# Patient Record
Sex: Male | Born: 1945 | Race: White | Hispanic: No | Marital: Married
Health system: Southern US, Community
[De-identification: ages and names within clinical notes are randomized; demographics above are authoritative.]

## PROBLEM LIST (undated history)

## (undated) DIAGNOSIS — K5090 Crohn's disease, unspecified, without complications: Secondary | ICD-10-CM

## (undated) DIAGNOSIS — K219 Gastro-esophageal reflux disease without esophagitis: Secondary | ICD-10-CM

## (undated) DIAGNOSIS — J45909 Unspecified asthma, uncomplicated: Secondary | ICD-10-CM

## (undated) HISTORY — DX: Gastro-esophageal reflux disease without esophagitis: K21.9

## (undated) HISTORY — DX: Crohn's disease, unspecified, without complications: K50.90

## (undated) HISTORY — DX: Unspecified asthma, uncomplicated: J45.909

---

## 2004-03-09 ENCOUNTER — Encounter: Admission: RE | Admit: 2004-03-09 | Discharge: 2004-03-09 | Payer: Self-pay | Admitting: Unknown Physician Specialty

## 2004-03-24 ENCOUNTER — Encounter: Admission: RE | Admit: 2004-03-24 | Discharge: 2004-03-24 | Payer: Self-pay | Admitting: Unknown Physician Specialty

## 2004-04-14 ENCOUNTER — Encounter: Admission: RE | Admit: 2004-04-14 | Discharge: 2004-04-14 | Payer: Self-pay | Admitting: Unknown Physician Specialty

## 2017-01-30 DIAGNOSIS — R079 Chest pain, unspecified: Secondary | ICD-10-CM | POA: Diagnosis not present

## 2017-01-30 DIAGNOSIS — K219 Gastro-esophageal reflux disease without esophagitis: Secondary | ICD-10-CM

## 2017-01-30 DIAGNOSIS — I2510 Atherosclerotic heart disease of native coronary artery without angina pectoris: Secondary | ICD-10-CM

## 2017-01-31 DIAGNOSIS — I2510 Atherosclerotic heart disease of native coronary artery without angina pectoris: Secondary | ICD-10-CM | POA: Diagnosis not present

## 2017-01-31 DIAGNOSIS — R079 Chest pain, unspecified: Secondary | ICD-10-CM | POA: Diagnosis not present

## 2017-01-31 DIAGNOSIS — K219 Gastro-esophageal reflux disease without esophagitis: Secondary | ICD-10-CM | POA: Diagnosis not present

## 2021-05-02 DIAGNOSIS — R6884 Jaw pain: Secondary | ICD-10-CM | POA: Diagnosis not present

## 2021-05-02 DIAGNOSIS — M542 Cervicalgia: Secondary | ICD-10-CM | POA: Diagnosis not present

## 2022-01-29 ENCOUNTER — Emergency Department (HOSPITAL_COMMUNITY): Payer: No Typology Code available for payment source

## 2022-01-29 ENCOUNTER — Other Ambulatory Visit: Payer: Self-pay

## 2022-01-29 ENCOUNTER — Emergency Department (HOSPITAL_COMMUNITY)
Admission: EM | Admit: 2022-01-29 | Discharge: 2022-01-29 | Disposition: A | Discharge status: Home / Self Care | Payer: No Typology Code available for payment source | Attending: Emergency Medicine | Admitting: Emergency Medicine

## 2022-01-29 ENCOUNTER — Encounter (HOSPITAL_COMMUNITY): Payer: Self-pay | Admitting: Emergency Medicine

## 2022-01-29 DIAGNOSIS — R079 Chest pain, unspecified: Secondary | ICD-10-CM

## 2022-01-29 DIAGNOSIS — I2510 Atherosclerotic heart disease of native coronary artery without angina pectoris: Secondary | ICD-10-CM | POA: Insufficient documentation

## 2022-01-29 DIAGNOSIS — Z7982 Long term (current) use of aspirin: Secondary | ICD-10-CM | POA: Insufficient documentation

## 2022-01-29 DIAGNOSIS — R61 Generalized hyperhidrosis: Secondary | ICD-10-CM | POA: Insufficient documentation

## 2022-01-29 LAB — CBC
HCT: 40.8 % (ref 39.0–52.0)
Hemoglobin: 13.6 g/dL (ref 13.0–17.0)
MCH: 32.6 pg (ref 26.0–34.0)
MCHC: 33.3 g/dL (ref 30.0–36.0)
MCV: 97.8 fL (ref 80.0–100.0)
Platelets: 249 10*3/uL (ref 150–400)
RBC: 4.17 MIL/uL — ABNORMAL LOW (ref 4.22–5.81)
RDW: 13 % (ref 11.5–15.5)
WBC: 5.1 10*3/uL (ref 4.0–10.5)
nRBC: 0 % (ref 0.0–0.2)

## 2022-01-29 LAB — BASIC METABOLIC PANEL
Anion gap: 7 (ref 5–15)
BUN: 17 mg/dL (ref 8–23)
CO2: 26 mmol/L (ref 22–32)
Calcium: 8.9 mg/dL (ref 8.9–10.3)
Chloride: 105 mmol/L (ref 98–111)
Creatinine, Ser: 0.91 mg/dL (ref 0.61–1.24)
GFR, Estimated: >60 mL/min (ref >60)
Glucose, Bld: 110 mg/dL — ABNORMAL HIGH (ref 70–99)
Potassium: 3.6 mmol/L (ref 3.5–5.1)
Sodium: 138 mmol/L (ref 135–145)

## 2022-01-29 LAB — TROPONIN I (HIGH SENSITIVITY)
Troponin I (High Sensitivity): 9 ng/L (ref <18)
Troponin I (High Sensitivity): 9 ng/L (ref <18)

## 2022-01-29 NOTE — ED Provider Notes (Signed)
?  Provider Note ?MRN:  [id number]  ?Arrival date & time: 01/29/22    ?ED Course and Medical Decision Making  ?Assumed care from Dr Oletta Cohn at shift change. ? ?76 yo male ?Woke up from sleep w/ cp ?Felt similar to prior mi ?Took nitro/asa; didn't relieve at that time ?Pain free on arrival ?Delta trop was neg. ?Chest pain is resolved ?He is HDS. ?Plan to f/u with cardiology as outpatient - he has appt in 2 weeks, asked him to call to see if he could be seen sooner. Strict return precautions were discussed. ?HEART score is 2 ? ?The patient improved significantly and was discharged in stable condition. Detailed discussions were had with the patient regarding current findings, and need for close f/u with PCP or on call doctor. The patient has been instructed to return immediately if the symptoms worsen in any way for re-evaluation. Patient verbalized understanding and is in agreement with current care plan. All questions answered prior to discharge. ? ? ? ?Procedures ? ?Final Clinical Impressions(s) / ED Diagnoses  ? ?  ICD-10-CM   ?1. Chest pain, unspecified type  R07.9   ?  ?  ?ED Discharge Orders   ? ? None  ? ?  ?  ? ? ?Discharge Instructions   ? ?  ?It was a pleasure caring for you today in the emergency department. ? ?Please follow up with your heart doctor ? ?Please return to the emergency department for any worsening or worrisome symptoms. ? ? ?Return to the Emergency Department if you have unusual chest pain, pressure, or discomfort, shortness of breath, nausea, vomiting, burping, heartburn, tingling upper body parts, sweating, cold, clammy skin, or racing heartbeat. Call 911 if you think you are having a heart attack. Take all cardiac medications as prescribed - notify your doctor if you have any side effects. Follow cardiac diet - avoid fatty & fried foods, don't eat too much red meat, eat lots of fruits & vegetables, and dairy products should be low fat. Please lose weight if you are overweight. Become more  active with walking, gardening, or any other activity that gets you to moving. ?  ?Please return to the emergency department immediately for any new or concerning symptoms, or if you get worse. ? ? ? ? ? ? ?  ?Sloan Leiter, DO ?01/29/22 1012 ? ?

## 2022-01-29 NOTE — ED Triage Notes (Signed)
?  Patient comes in with L sided chest pain that woke patient up from sleeping around 0330 this morning.  Patient took 1 nitro and 324 ASA before calling EMS.  EMS gave 3 additional nitro.  Pain went from 8 to 2 on arrival. Patient states he had "cold sweats" during the event.  No N/V.  Pain 2/10, pressure on L chest. ?

## 2022-01-29 NOTE — Discharge Instructions (Addendum)
It was a pleasure caring for you today in the emergency department. ? ?Please follow up with your heart doctor ? ?Please return to the emergency department for any worsening or worrisome symptoms. ? ? ?Return to the Emergency Department if you have unusual chest pain, pressure, or discomfort, shortness of breath, nausea, vomiting, burping, heartburn, tingling upper body parts, sweating, cold, clammy skin, or racing heartbeat. Call 911 if you think you are having a heart attack. Take all cardiac medications as prescribed - notify your doctor if you have any side effects. Follow cardiac diet - avoid fatty & fried foods, don't eat too much red meat, eat lots of fruits & vegetables, and dairy products should be low fat. Please lose weight if you are overweight. Become more active with walking, gardening, or any other activity that gets you to moving. ?  ?Please return to the emergency department immediately for any new or concerning symptoms, or if you get worse. ?

## 2022-01-29 NOTE — ED Provider Notes (Signed)
?[facility] ?Provider Note ? ? ?CSN: [id number] ?Arrival date & time: 01/29/22  [id number] ? ?  ? ?History ? ?Chief Complaint  ?Patient presents with  ? Chest Pain  ? ? ?Douglas Jones is a 76 y.o. male. ? ?Patient presents to the emergency department for evaluation of chest pain.  Patient reports left-sided chest pain that awakened him from sleep.  Patient reports that he had stents several years ago and prior to the stenting he had similar chest pain.  Pain was in the left side of the chest, mild associated diaphoresis.  No nausea or shortness of breath.  Patient took aspirin and nitro at home and then EMS gave him additional aspirin and nitro.  He reports that around the time he arrived to the hospital, pain resolved. ? ? ?  ? ?Home Medications ?Prior to Admission medications   ?Not on File  ?   ? ?Allergies    ?Patient has no known allergies.   ? ?Review of Systems   ?Review of Systems  ?Cardiovascular:  Positive for chest pain.  ? ?Physical Exam ?Updated Vital Signs ?BP 110/73   Pulse 66   Temp 98.2 ?F (36.8 ?C) (Oral)   Resp 16   Ht 5\' 10"  (1.778 m)   Wt 86.2 kg   SpO2 94%   BMI 27.26 kg/m?  ?Physical Exam ?Vitals and nursing note reviewed.  ?Constitutional:   ?   General: He is not in acute distress. ?   Appearance: He is well-developed.  ?HENT:  ?   Head: Normocephalic and atraumatic.  ?   Mouth/Throat:  ?   Mouth: Mucous membranes are moist.  ?Eyes:  ?   General: Vision grossly intact. Gaze aligned appropriately.  ?   Extraocular Movements: Extraocular movements intact.  ?   Conjunctiva/sclera: Conjunctivae normal.  ?Cardiovascular:  ?   Rate and Rhythm: Normal rate and regular rhythm.  ?   Pulses: Normal pulses.  ?   Heart sounds: Normal heart sounds, S1 normal and S2 normal. No murmur heard. ?  No friction rub. No gallop.  ?Pulmonary:  ?   Effort: Pulmonary effort is normal. No respiratory distress.  ?   Breath sounds: Normal breath sounds.  ?Abdominal:  ?    Palpations: Abdomen is soft.  ?   Tenderness: There is no abdominal tenderness. There is no guarding or rebound.  ?   Hernia: No hernia is present.  ?Musculoskeletal:     ?   General: No swelling.  ?   Cervical back: Full passive range of motion without pain, normal range of motion and neck supple. No pain with movement, spinous process tenderness or muscular tenderness. Normal range of motion.  ?   Right lower leg: No edema.  ?   Left lower leg: No edema.  ?Skin: ?   General: Skin is warm and dry.  ?   Capillary Refill: Capillary refill takes less than 2 seconds.  ?   Findings: No ecchymosis, erythema, lesion or wound.  ?Neurological:  ?   Mental Status: He is alert and oriented to person, place, and time.  ?   GCS: GCS eye subscore is 4. GCS verbal subscore is 5. GCS motor subscore is 6.  ?   Cranial Nerves: Cranial nerves 2-12 are intact.  ?   Sensory: Sensation is intact.  ?   Motor: Motor function is intact. No weakness or abnormal muscle tone.  ?   Coordination: Coordination is intact.  ?  Psychiatric:     ?   Mood and Affect: Mood normal.     ?   Speech: Speech normal.     ?   Behavior: Behavior normal.  ? ? ?ED Results / Procedures / Treatments   ?Labs ?(all labs ordered are listed, but only abnormal results are displayed) ?Labs Reviewed  ?BASIC METABOLIC PANEL - Abnormal; Notable for the following components:  ?    Result Value  ? Glucose, Bld 110 (*)   ? All other components within normal limits  ?CBC - Abnormal; Notable for the following components:  ? RBC 4.17 (*)   ? All other components within normal limits  ?TROPONIN I (HIGH SENSITIVITY)  ?TROPONIN I (HIGH SENSITIVITY)  ? ? ?EKG ?None ? ?Radiology ?DG Chest 2 View ? ?Result Date: 01/29/2022 ?CLINICAL DATA:  76 year old male with history of chest pain. EXAM: CHEST - 2 VIEW COMPARISON:  Chest x-ray 01/30/2017. FINDINGS: Lung volumes are low. No consolidative airspace disease. No pleural effusions. No pneumothorax. No pulmonary nodule or mass noted.  Pulmonary vasculature and the cardiomediastinal silhouette are within normal limits. Atherosclerosis in the thoracic aorta. Status post median sternotomy for CABG. Status post ORIF in the left proximal humerus with lateral plate and screw fixation device traversing a healed fracture. IMPRESSION: 1. Low lung volumes without radiographic evidence of acute cardiopulmonary disease. 2. Aortic atherosclerosis. 3. Postoperative changes, as above. Electronically Signed   By: Vinnie Langton M.D.   On: 01/29/2022 06:31   ? ?Procedures ?Procedures  ? ? ?Medications Ordered in ED ?Medications - No data to display ? ?ED Course/ Medical Decision Making/ A&P ?  ?                        ?Medical Decision Making ?Amount and/or Complexity of Data Reviewed ?Labs: ordered. ?Radiology: ordered. ? ? ?Patient presents to the emergency department for evaluation of chest pain.  Patient reports a history of coronary artery disease, has had stenting several years ago.  Patient reports that the pain he experienced tonight did feel similar to his cardiac chest pain.  His pain did, however, resolved prior to my evaluation.  He has been pain-free throughout the remainder of the examination.  EKG without ST elevations or obvious ischemia.  First troponin negative.  Patient will have a second high-sensitivity troponin.  If negative and he continues to be pain-free, would be reasonable to discharge patient and have prompt cardiology follow-up.  Will sign to oncoming ER physician.  If second troponin is elevated, contact cardiology for disposition. ? ? ? ? ? ? ? ?Final Clinical Impression(s) / ED Diagnoses ?Final diagnoses:  ?Chest pain, unspecified type  ? ? ?Rx / DC Orders ?ED Discharge Orders   ? ? None  ? ?  ? ? ?  ?Orpah Greek, MD ?01/29/22 [phone] ? ?

## 2022-06-16 ENCOUNTER — Ambulatory Visit: Payer: Self-pay | Admitting: Allergy

## 2022-06-20 ENCOUNTER — Encounter: Payer: Self-pay | Admitting: Internal Medicine

## 2022-06-20 ENCOUNTER — Ambulatory Visit: Payer: Medicare PPO | Admitting: Internal Medicine

## 2022-06-20 VITALS — BP 126/66 | HR 70 | Temp 98.2°F | Resp 18 | Ht 70.0 in | Wt 193.5 lb

## 2022-06-20 DIAGNOSIS — J4530 Mild persistent asthma, uncomplicated: Secondary | ICD-10-CM

## 2022-06-20 DIAGNOSIS — J3089 Other allergic rhinitis: Secondary | ICD-10-CM

## 2022-06-20 DIAGNOSIS — K1120 Sialoadenitis, unspecified: Secondary | ICD-10-CM

## 2022-06-20 DIAGNOSIS — M3509 Sicca syndrome with other organ involvement: Secondary | ICD-10-CM

## 2022-06-20 DIAGNOSIS — K219 Gastro-esophageal reflux disease without esophagitis: Secondary | ICD-10-CM

## 2022-06-20 DIAGNOSIS — J45991 Cough variant asthma: Secondary | ICD-10-CM

## 2022-06-20 NOTE — Patient Instructions (Addendum)
Salivary Gland Swelling  -Given your previous diagnosis of Sjogren's syndrome I suspect this is more of an autoimmune or infiltrative disease process -We will update your lab testing for Sjogren's and look for other infiltrative diseases such as IgG4 -Recommend reestablishing care with rheumatology and ENT -We will focus on controlling any other possible triggers for throat irritation/inflammation with plan below   Chronic Cough  -Breathing tests today looked normal! - Allergy test today showed positive to grass pollen, ragweed, with intradermal's positive to tree mix and mold mix 1 Etiology of chronic cough is broad. Common considerations include asthma, COPD, allergic rhinitis, nonallergic rhinitis, infections, reflux (GERD/LPR), neurogenic and/or habitual cough.  Mainstay of treatment is to control all possible triggers and address the cough hypersensitivity aspect.   The history and physical examination suggest this cough is multifactorial and potentially attributed to  GERD, uncontrolled asthma, and oral dysphagia, rhinitis  We will address  Asthma, rhinitis  and GERD at this time.   PLAN:  Continue Advair 2 puffs twice daily  Continue Albuterol 2 puffs every 4-6 hours as needed for cough  Continue Famotidine 20mg  twice a day for GERD control   START:  Flonase 1 spray per nostril twice daily  Zyrtec 10mg  daily   Follow up: We will contact you with blood results and you can follow-up in 3 months in clinic with myself Strongly recommend reestablishing care with ENT and rheumatology  Thank you so much for letting me partake in your care today.  Don't hesitate to reach out if you have any additional concerns!  , MD  [facility] [place], [place]  Reducing Pollen Exposure  The American Academy of Allergy, Asthma and Immun[employer] suggests the following steps to reduce your exposure to pollen during allergy seasons.    Do not hang sheets or clothing out to  dry; pollen may collect on these items. Do not mow lawns or spend time around freshly cut grass; mowing stirs up pollen. Keep windows closed at night.  Keep car windows closed while driving. Minimize morning activities outdoors, a time when pollen counts are usually at their highest. Stay indoors as much as possible when pollen counts or humidity is high and on windy days when pollen tends to remain in the air longer. Use air conditioning when possible.  Many air conditioners have filters that trap the pollen spores. Use a HEPA room air filter to remove pollen form the indoor air you breathe.  Control of Mold Allergen   Mold and fungi can grow on a variety of surfaces provided certain temperature and moisture conditions exist.  Outdoor molds grow on plants, decaying vegetation and soil.  The major outdoor mold, Alternaria and Cladosporium, are found in very high numbers during hot and dry conditions.  Generally, a late Summer - Fall peak is seen for common outdoor fungal spores.  Rain will temporarily lower outdoor mold spore count, but counts rise rapidly when the rainy period ends.  The most important indoor molds are Aspergillus and Penicillium.  Dark, humid and poorly ventilated basements are ideal sites for mold growth.  The next most common sites of mold growth are the bathroom and the kitchen.  Outdoor (Seasonal) Mold Control  Positive outdoor molds via skin testing: Alternaria and Cladosporium  Use air conditioning and keep windows closed Avoid exposure to decaying vegetation. Avoid leaf raking. Avoid grain handling. Consider wearing a face mask if working in moldy areas.

## 2022-06-20 NOTE — Progress Notes (Signed)
New Patient Note  RE: Douglas Jones MRN: [id number] DOB: Feb 12, 1946 Date of Office Visit: 06/20/2022  Consult requested by: No ref. provider found Primary care provider: [facility]  Chief Complaint: Other, Cough, and Post Nassl Drainage  History of Present Illness: I had the pleasure of seeing Douglas Jones for initial evaluation at the [facility] on 06/20/2022. He is a 76 y.o. male, who is referred here by [facility] for the evaluation of chronic cough.  He has a history of chrohns disease and cough worsened since starting humira.    History obtained from patient  and  chart review .  History slightly difficult to obtain.  He reports a 5-year history of painful and tender lymph nodes in his neck.  He has been seen by rheumatology and ENT.  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  He was last seen by them in July 16, 2021.  He feels like his symptoms are due to poorly controlled Crohn's disease which she follows with GI for and is on Humira.  Records also indicate increasing cough after increasing Humira dose which he requested due to his lymphadenopathy.  He was started on Advair for productive cough which is helped cough somewhat.  He reports his lymph node swelling is persistent and sometimes causes painful swallowing for solids and liquids.  Per patient he had a biopsy which returned as normal tissue.  He may have underlying rhinitis as he does report some postnasal drip.  He did have a mild eosinophilia when cough first appeared, however this has resolved per patient.  He has been given prednisone for symptoms, but denies any symptomatic relief.     Assessment and Plan: Douglas Jones is a 76 y.o. 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) - Plan: CBC With Differential, Sed Rate (ESR), Immunoglobulins, QN, A/E/G/M, IgG 1, 2, 3, and 4, Sjogrens syndrome-A extractable nuclear antibody, Sjogrens syndrome-B extractable nuclear antibody, C-reactive protein Plan: Patient Instructions  Salivary Gland Swelling  -Given your previous diagnosis of Sjogren's syndrome I suspect this is more of an autoimmune or infiltrative disease process -We will update your lab testing for Sjogren's and look for other infiltrative diseases such as IgG4 -Recommend reestablishing care with rheumatology and ENT -We will focus on controlling any other possible triggers for throat irritation/inflammation with plan below   Chronic Cough  -Breathing tests today looked normal! - Allergy test today showed positive to grass pollen, ragweed, with intradermal's positive to tree mix and mold mix 1 Etiology of chronic cough is broad. Common considerations include asthma, COPD, allergic rhinitis, nonallergic rhinitis, infections, reflux (GERD/LPR), neurogenic and/or habitual cough.  Mainstay of treatment is to control all possible triggers and address the cough hypersensitivity aspect.   The history and physical examination suggest this cough is multifactorial and potentially attributed to  GERD, uncontrolled asthma, and oral dysphagia, rhinitis  We will address  Asthma, rhinitis  and GERD at this time.   PLAN:  Continue Advair 2 puffs twice daily  Continue Albuterol 2  puffs every 4-6 hours as needed for cough  Continue Famotidine 18m twice a day for GERD control   START:  Flonase 1 spray per nostril twice  daily  Zyrtec 146mdaily   Follow up: We will contact you with blood results and you can follow-up in 3 months in clinic with myself Strongly recommend reestablishing care with ENT and rheumatology  Thank you so much for letting me partake in your care today.  Don't hesitate to reach out if you have any additional concerns!  EvRoney MarionMD  [facility] [place], [place]  Reducing Pollen Exposure  The American Academy of Allergy, Asthma and Immunology suggests the following steps to reduce your exposure to pollen during allergy seasons.    Do not hang sheets or clothing out to dry; pollen may collect on these items. Do not mow lawns or spend time around freshly cut grass; mowing stirs up pollen. Keep windows closed at night.  Keep car windows closed while driving. Minimize morning activities outdoors, a time when pollen counts are usually at their highest. Stay indoors as much as possible when pollen counts or humidity is high and on windy days when pollen tends to remain in the air longer. Use air conditioning when possible.  Many air conditioners have filters that trap the pollen spores. Use a HEPA room air filter to remove pollen form the indoor air you breathe.  Control of Mold Allergen   Mold and fungi can grow on a variety of surfaces provided certain temperature and moisture conditions exist.  Outdoor molds grow on plants, decaying vegetation and soil.  The major outdoor mold, Alternaria and Cladosporium, are found in very high numbers during hot and dry conditions.  Generally, a late Summer - Fall peak is seen for common outdoor fungal spores.  Rain will temporarily lower outdoor mold spore count, but counts rise rapidly when the rainy period ends.  The most important indoor molds are Aspergillus and Penicillium.  Dark, humid and poorly ventilated basements are ideal sites for mold growth.  The next most common sites of mold growth are the bathroom and the  kitchen.  Outdoor (Seasonal) Mold Control  Positive outdoor molds via skin testing: Alternaria and Cladosporium  Use air conditioning and keep windows closed Avoid exposure to decaying vegetation. Avoid leaf raking. Avoid grain handling. Consider wearing a face mask if working in moldy areas.     No follow-ups on file.  No orders of the defined types were placed in this encounter.  Lab Orders         CBC With Differential         Sed Rate (ESR)         Immunoglobulins, QN, A/E/G/M         IgG 1, 2, 3, and 4         Sjogrens syndrome-A extractable nuclear antibody         Sjogrens syndrome-B extractable nuclear antibody         C-reactive protein      Other allergy screening: Asthma:  chronic cough, records indicate history of cough variant asthma Rhino conjunctivitis:  mild  Food allergy: no Medication allergy: yes Hymenoptera allergy: no Urticaria: no Eczema:no History of recurrent infections suggestive of immunodeficency: no  Diagnostics: Spirometry:  Tracings reviewed. His effort: Good reproducible efforts. FVC: 3.06L FEV1: 2.55L, 85% predicted FEV1/FVC ratio: 83% Interpretation: Spirometry consistent with normal pattern.  Please see scanned spirometry results for details.  Skin Testing: Environmental  allergy panel. positive to grass pollen, ragweed, with intradermal's positive to tree mix and mold mix 1 Results interpreted by myself and discussed with patient/family.  Airborne Adult Perc - 06/20/22 1034     Time Antigen Placed 1034    Allergen Manufacturer Lavella Hammock    Location Back    Number of Test 59    Panel 1 Select    1. Control-Buffer 50% Glycerol Negative    2. Control-Histamine 1 mg/ml 4+    3. Albumin saline Negative    4. [place] 3+    5. [place] 3+    6. Johnson Negative    7. Tees Toh Blue Negative    8. Meadow Fescue Negative    9. Perennial Rye Negative    10. Sweet Vernal Negative    11. Timothy Negative    12. Cocklebur Negative     13. Burweed Marshelder Negative    14. Ragweed, short Negative    15. Ragweed, Giant 3+    16. Plantain,  English Negative    17. Lamb's Quarters Negative    18. Sheep Sorrell Negative    19. Rough Pigweed Negative    20. Marsh Elder, Rough Negative    21. Mugwort, Common Negative    22. Ash mix Negative    23. Birch mix Negative    24. Beech American Negative    25. Box, Elder Negative    26. Cedar, red Negative    27. Cottonwood, [place] Negative    28. Elm mix Negative    29. Hickory Negative    30. Maple mix Negative    31. Oak, [place] mix Negative    32. Pecan Pollen Negative    33. Pine mix Negative    34. Sycamore Eastern Negative    35. Sycamore, Black Pollen Negative    36. Alternaria alternata Negative    37. Cladosporium Herbarum Negative    38. Aspergillus mix Negative    39. Penicillium mix Negative    40. Bipolaris sorokiniana (Helminthosporium) Negative    41. Drechslera spicifera (Curvularia) Negative    42. Mucor plumbeus Negative    43. Fusarium moniliforme Negative    44. Aureobasidium pullulans (pullulara) Negative    45. Rhizopus oryzae Negative    46. Botrytis cinera Negative    47. Epicoccum nigrum Negative    48. Phoma betae Negative    49. Candida Albicans Negative    50. Trichophyton mentagrophytes Negative    51. Mite, D Farinae  5,000 AU/ml Negative    52. Mite, D Pteronyssinus  5,000 AU/ml Negative    53. Cat Hair 10,000 BAU/ml Negative    54.  Dog Epithelia Negative    55. Mixed Feathers Negative    56. Horse Epithelia Negative    57. Cockroach, German Negative    58. Mouse Negative    59. Tobacco Leaf Negative             Intradermal - 06/20/22 1154     Time Antigen Placed 1154    Allergen Manufacturer Lavella Hammock    Location Back    Number of Test 13    Control Negative    Johnson Negative    7 Grass Negative    Weed mix Negative    Tree mix 2+    Mold 1 2+    Mold 2 Negative    Mold 3 Negative    Mold 4 Negative    Cat  Negative    Dog Negative    Cockroach  Negative    Mite mix Negative             Past Medical History: There are no problems to display for this patient.  Past Medical History:  Diagnosis Date   Acid reflux    Asthma    Crohn's disease ([place])    Past Surgical History: No past surgical history on file. Medication List:  Current Outpatient Medications  Medication Sig Dispense Refill   ABRYSVO 120 MCG/0.5ML SOLR Inject into the muscle.     Adalimumab 40 MG/0.4ML PNKT INJECT 40MG (0.4ML) SUBCUTANEOUSLY EVERY 7 DAYS *FOR CROHN'S*     albuterol (VENTOLIN HFA) 108 (90 Base) MCG/ACT inhaler INHALE 1 PUFF BY MOUTH FOUR TIMES A DAY AS NEEDED     aspirin EC 81 MG tablet Take by mouth.     Cholecalciferol 100 MCG (4000 UT) TABS Take 1 tablet by mouth daily.     cyanocobalamin (VITAMIN B12) 1000 MCG tablet Take by mouth.     famotidine (PEPCID) 40 MG tablet TAKE ONE TABLET BY MOUTH TWICE A DAY FOR GERD     fluticasone-salmeterol (ADVAIR HFA) 230-21 MCG/ACT inhaler INHALE 2 PUFFS BY MOUTH TWICE A DAY (RINSE MOUTH WELL WITH WATER AFTER EACH USE) USE WITH SPACER DEVICE     lisinopril (ZESTRIL) 2.5 MG tablet Take by mouth.     Magnesium Oxide -Mg Supplement 250 MG TABS Take by mouth.     metoprolol succinate (TOPROL-XL) 25 MG 24 hr tablet Take by mouth.     pravastatin (PRAVACHOL) 20 MG tablet TK 1 T PO NIGHTLY     ticagrelor (BRILINTA) 90 MG TABS tablet TK 1 T PO BID     budesonide-formoterol (SYMBICORT) 160-4.5 MCG/ACT inhaler Inhale into the lungs. (Patient not taking: Reported on 06/20/2022)     No current facility-administered medications for this visit.   Allergies: Allergies  Allergen Reactions   Mesalamine Rash and Other (See Comments)    Other reaction(s): Unknown    Atorvastatin Other (See Comments)    Other reaction(s): Muscle pain, Myoglobinuria, Muscle pain, Myoglobinuria    Penicillin G    Simvastatin    Venlafaxine Other (See Comments)    Other reaction(s): Insomnia     Tramadol Itching    Stop taking do to itching Stop taking do to itching Stop taking do to itching Stop taking do to itching    Social History: Social History   Socioeconomic History   Marital status: Married    Spouse name: Not on file   Number of children: Not on file   Years of education: Not on file   Highest education level: Not on file  Occupational History   Not on file  Tobacco Use   Smoking status: Not on file   Smokeless tobacco: Not on file  Substance and Sexual Activity   Alcohol use: Not on file   Drug use: Not on file   Sexual activity: Not on file  Other Topics Concern   Not on file  Social History Narrative   Not on file   Social Determinants of Health   Financial Resource Strain: Not on file  Food Insecurity: Not on file  Transportation Needs: Not on file  Physical Activity: Not on file  Stress: Not on file  Social Connections: Not on file   Lives in a single-family home that was built 100 years ago.  There are no roaches in the house and bed is 2 feet off the floor.  He does not have dust  mite precautions on bed or pillows.  He is not exposed to smoke or tobacco.  He is not exposed to fumes, chemicals or dust.  There is a HEPA filter in the home and home is not not near an interstate or industrial area. Smoking: Prior smoker from [age], Occupation: Retired  Programme researcher, broadcasting/film/video History: Environmental education officer in the house: no Charity fundraiser in the family room: no Carpet in the bedroom: no Heating: heat pump Cooling: heat pump Pet: yes 1 cat and dog without access to bedroom  Family History: No family history on file.   ROS: All others negative except as noted per HPI.   Objective: BP 126/66   Pulse 70   Temp 98.2 F (36.8 C) (Temporal)   Resp 18   Ht 5' 10"  (1.778 m)   Wt 193 lb 8 oz (87.8 kg)   SpO2 96%   BMI 27.76 kg/m  Body mass index is 27.76 kg/m.  General Appearance:  Alert, cooperative, no distress, appears stated age  Head:   Normocephalic, without obvious abnormality, atraumatic  Eyes:  Conjunctiva clear, EOM's intact  Nose: Nares normal,  erythematous nasal mucosa, hypertrophic turbinates, no visible anterior polyps, and septum midline  Throat: Lips, tongue normal; teeth and gums normal, no frank lymphadenopathy palpated + cobblestoning  Neck: Supple, symmetrical  Lungs:   clear to auscultation bilaterally, Respirations unlabored, no coughing  Heart:  regular rate and rhythm and no murmur, Appears well perfused  Extremities: No edema  Skin: Skin color, texture, turgor normal, no rashes or lesions on visualized portions of skin  Neurologic: No gross deficits   The plan was reviewed with the patient/family, and all questions/concerned were addressed.  It was my pleasure to see Douglas Jones today and participate in his care. Please feel free to contact me with any questions or concerns.  Sincerely,  Roney Marion, MD Allergy & Immunology  [facility] office: [phone] [facility] office: [phone]

## 2022-06-22 LAB — CBC WITH DIFFERENTIAL
Basophils Absolute: 0.1 10*3/uL (ref 0.0–0.2)
Basos: 2 %
EOS (ABSOLUTE): 0.1 10*3/uL (ref 0.0–0.4)
Eos: 1 %
Hematocrit: 46.1 % (ref 37.5–51.0)
Hemoglobin: 15.9 g/dL (ref 13.0–17.7)
Immature Grans (Abs): 0 10*3/uL (ref 0.0–0.1)
Immature Granulocytes: 0 %
Lymphocytes Absolute: 1.9 10*3/uL (ref 0.7–3.1)
Lymphs: 32 %
MCH: 32.3 pg (ref 26.6–33.0)
MCHC: 34.5 g/dL (ref 31.5–35.7)
MCV: 94 fL (ref 79–97)
Monocytes Absolute: 0.5 10*3/uL (ref 0.1–0.9)
Monocytes: 9 %
Neutrophils Absolute: 3.2 10*3/uL (ref 1.4–7.0)
Neutrophils: 56 %
RBC: 4.92 x10E6/uL (ref 4.14–5.80)
RDW: 12.1 % (ref 11.6–15.4)
WBC: 5.8 10*3/uL (ref 3.4–10.8)

## 2022-06-22 LAB — IGG 1, 2, 3, AND 4
IgG (Immunoglobin G), Serum: 1253 mg/dL (ref 603–1613)
IgG, Subclass 1: 663 mg/dL (ref 248–810)
IgG, Subclass 2: 352 mg/dL (ref 130–555)
IgG, Subclass 3: 59 mg/dL (ref 15–102)
IgG, Subclass 4: 61 mg/dL (ref 2–96)

## 2022-06-22 LAB — C-REACTIVE PROTEIN: CRP: <1 mg/L (ref 0–10)

## 2022-06-22 LAB — IMMUNOGLOBULINS A/E/G/M, SERUM
IgA/Immunoglobulin A, Serum: 181 mg/dL (ref 61–437)
IgE (Immunoglobulin E), Serum: 8 IU/mL (ref 6–495)
IgM (Immunoglobulin M), Srm: 125 mg/dL (ref 15–143)

## 2022-06-22 LAB — SJOGRENS SYNDROME-A EXTRACTABLE NUCLEAR ANTIBODY: ENA SSA (RO) Ab: <0.2 AI (ref 0.0–0.9)

## 2022-06-22 LAB — SEDIMENTATION RATE: Sed Rate: 13 mm/hr (ref 0–30)

## 2022-06-22 LAB — SJOGRENS SYNDROME-B EXTRACTABLE NUCLEAR ANTIBODY: ENA SSB (LA) Ab: <0.2 AI (ref 0.0–0.9)

## 2022-06-22 NOTE — Progress Notes (Signed)
Blood work returned all normal no evidence of an underlying immunologic or allergic cause for swollen salivary glands.  Recommend patient follow-up with rheumatology and ENT for further management.

## 2022-07-11 IMAGING — CR DG CHEST 2V
2 series · 2 of 2 positions shown · non-contrast
Comparison: Chest x-ray 01/30/2017.

CLINICAL DATA: 76-year-old male with history of chest pain.

EXAM:
CHEST - 2 VIEW

[chest lat]
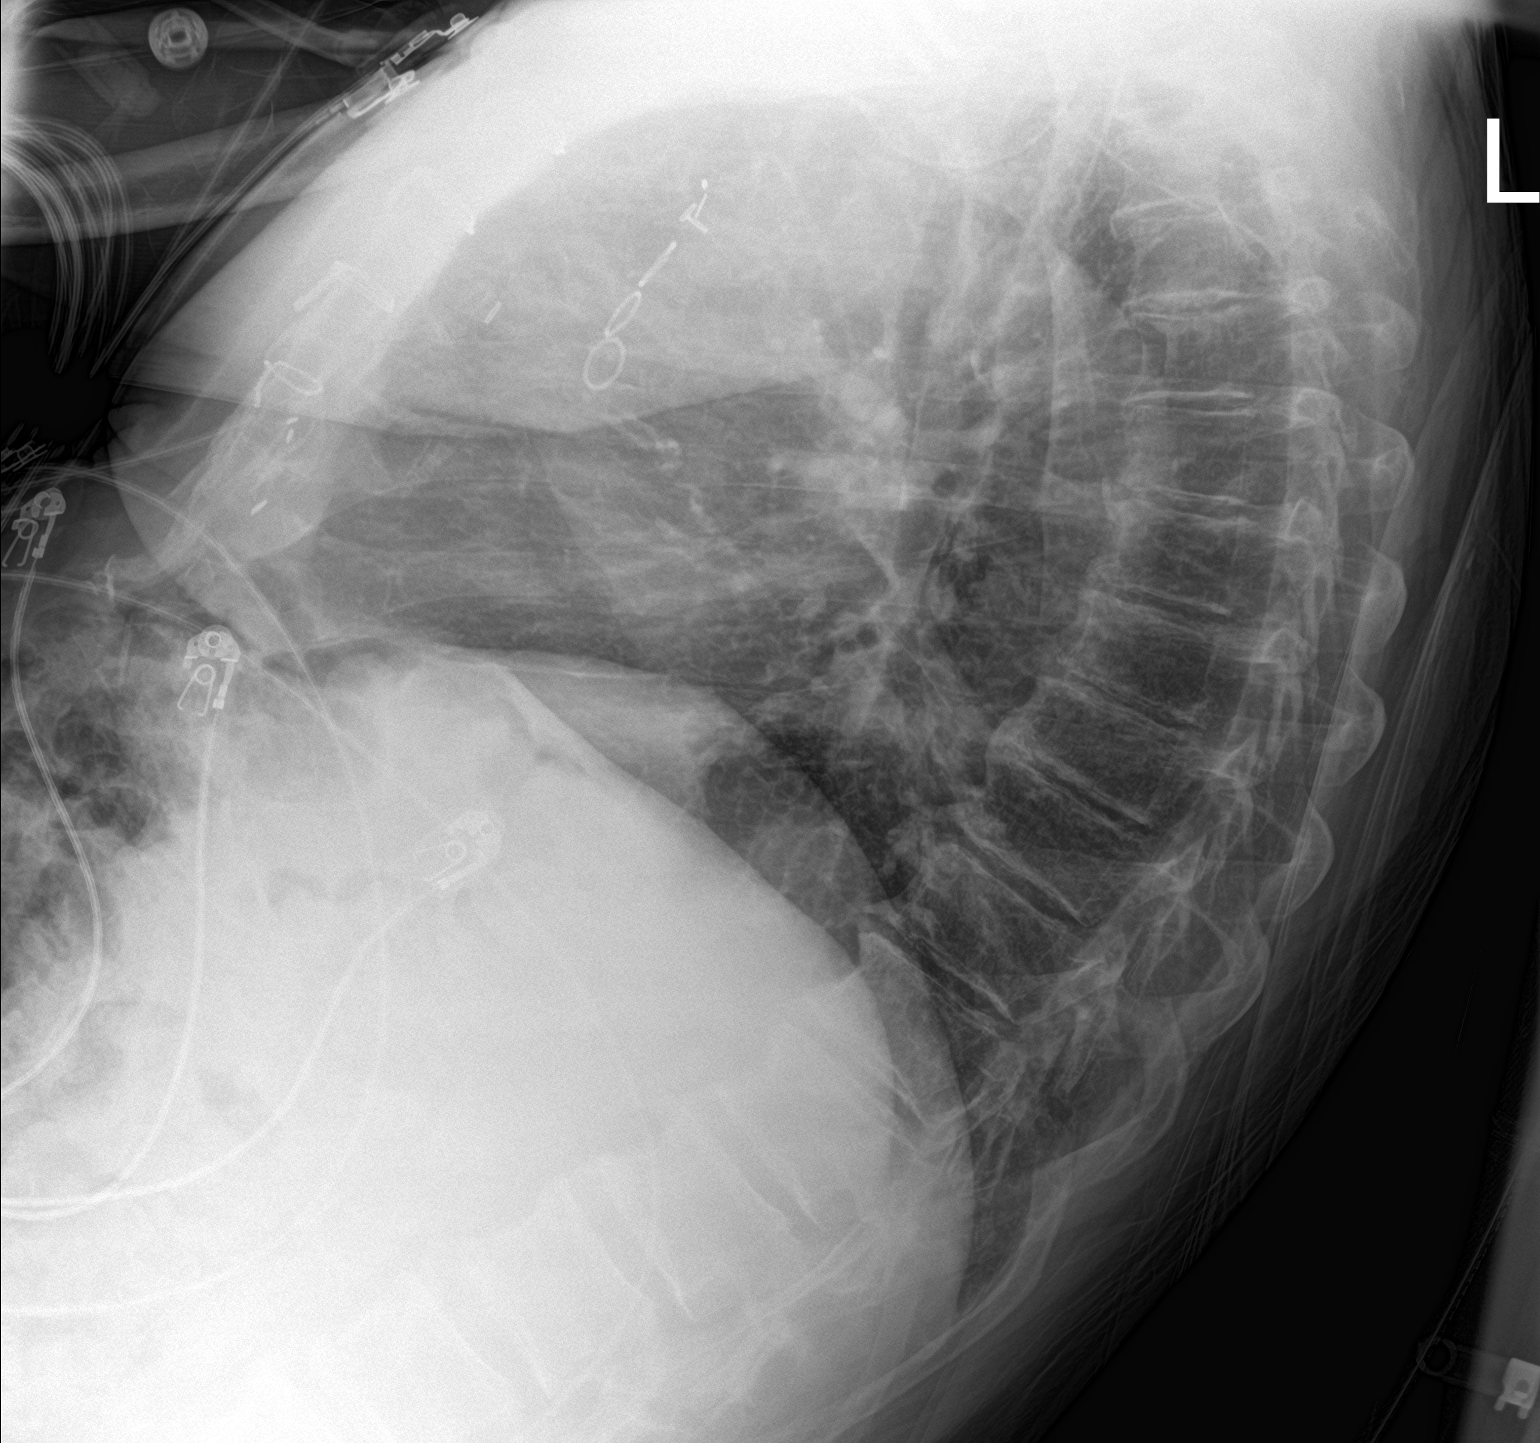

[chest ap]
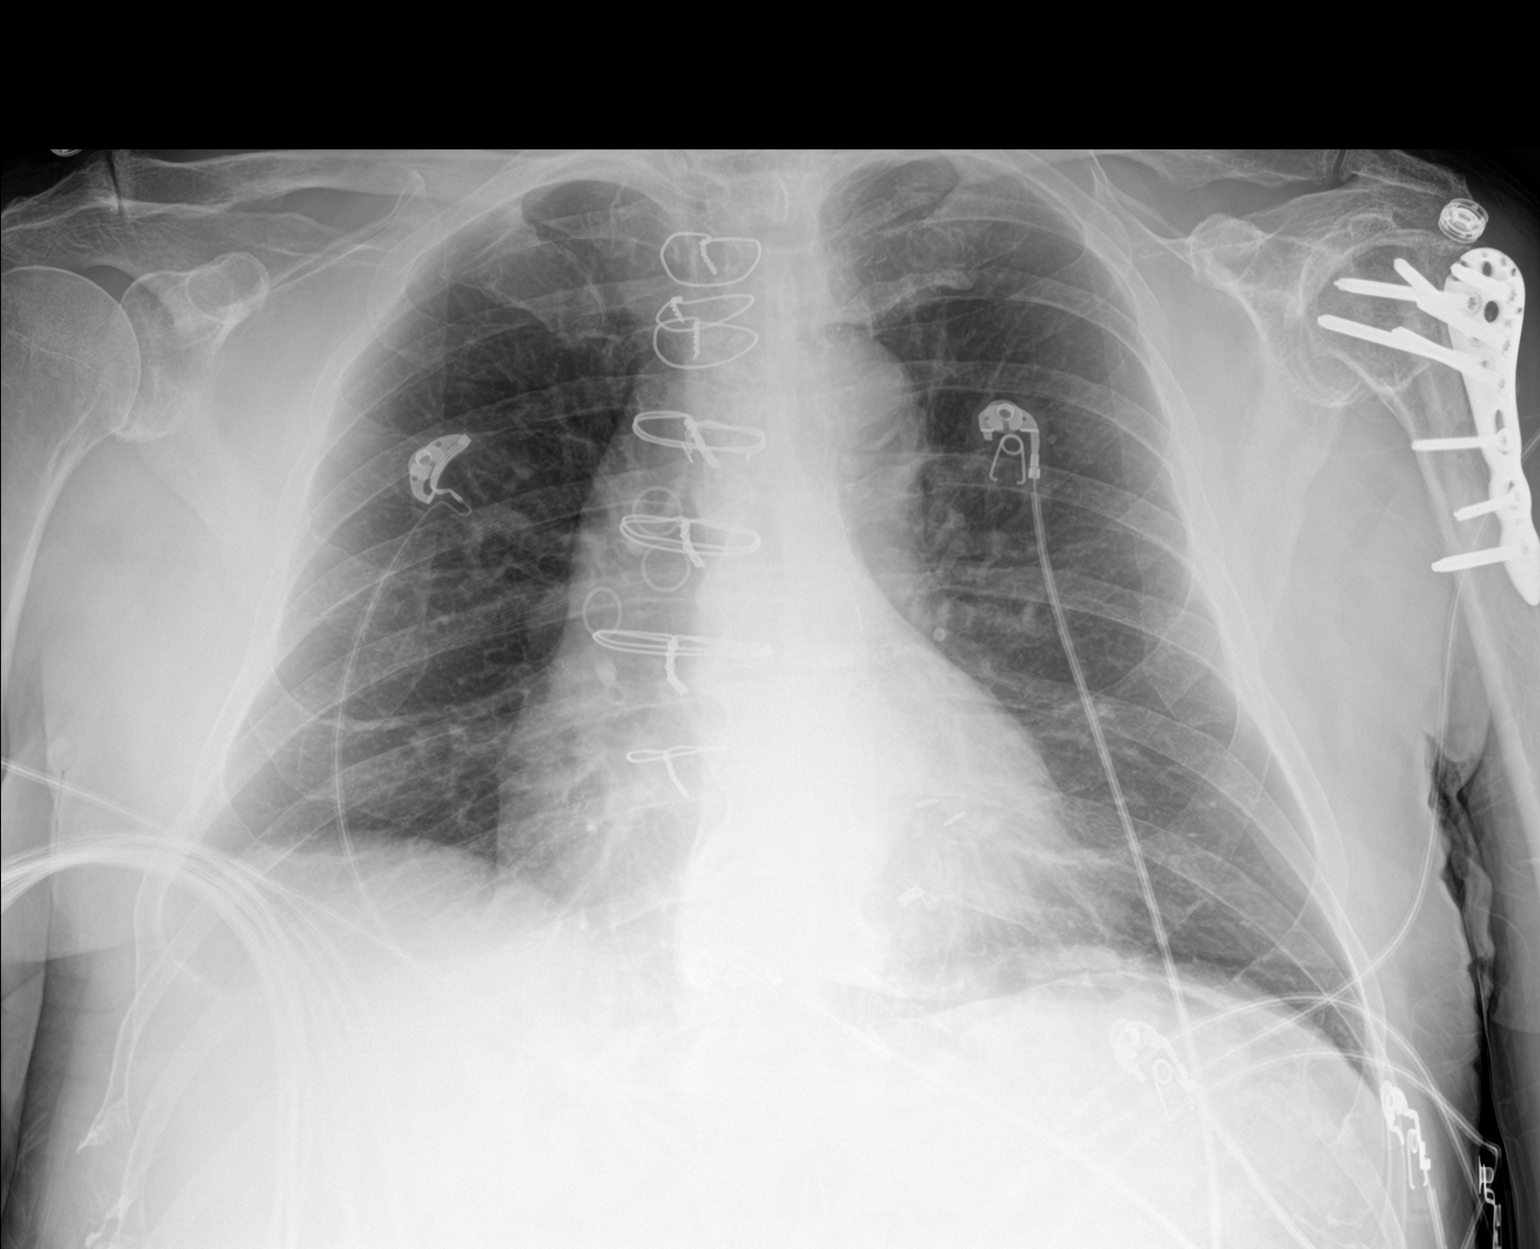

[2 of 2 positions shown; findings below may reference images not displayed]

FINDINGS: Lung volumes are low. No consolidative airspace disease. No pleural
effusions. No pneumothorax. No pulmonary nodule or mass noted.
Pulmonary vasculature and the cardiomediastinal silhouette are
within normal limits. Atherosclerosis in the thoracic aorta. Status
post median sternotomy for CABG. Status post ORIF in the left
proximal humerus with lateral plate and screw fixation device
traversing a healed fracture.
IMPRESSION: 1. Low lung volumes without radiographic evidence of acute
cardiopulmonary disease.
2. Aortic atherosclerosis.
3. Postoperative changes, as above.

## 2022-07-12 ENCOUNTER — Encounter: Payer: Self-pay | Admitting: *Deleted

## 2023-08-23 DIAGNOSIS — R079 Chest pain, unspecified: Secondary | ICD-10-CM | POA: Diagnosis not present

## 2024-06-02 DIAGNOSIS — K810 Acute cholecystitis: Secondary | ICD-10-CM | POA: Diagnosis not present

## 2024-06-03 DIAGNOSIS — K810 Acute cholecystitis: Secondary | ICD-10-CM | POA: Diagnosis not present
